# Patient Record
Sex: Male | Born: 1960 | Race: White | Hispanic: No | Smoking: Former smoker
Health system: Southern US, Community
[De-identification: ages and names within clinical notes are randomized; demographics above are authoritative.]

## PROBLEM LIST (undated history)

## (undated) DIAGNOSIS — I491 Atrial premature depolarization: Secondary | ICD-10-CM

## (undated) DIAGNOSIS — I499 Cardiac arrhythmia, unspecified: Secondary | ICD-10-CM

## (undated) DIAGNOSIS — IMO0001 Reserved for inherently not codable concepts without codable children: Secondary | ICD-10-CM

## (undated) DIAGNOSIS — M1990 Unspecified osteoarthritis, unspecified site: Secondary | ICD-10-CM

## (undated) DIAGNOSIS — Z87442 Personal history of urinary calculi: Secondary | ICD-10-CM

## (undated) DIAGNOSIS — G4730 Sleep apnea, unspecified: Secondary | ICD-10-CM

## (undated) DIAGNOSIS — K259 Gastric ulcer, unspecified as acute or chronic, without hemorrhage or perforation: Secondary | ICD-10-CM

## (undated) DIAGNOSIS — K219 Gastro-esophageal reflux disease without esophagitis: Secondary | ICD-10-CM

## (undated) DIAGNOSIS — I493 Ventricular premature depolarization: Secondary | ICD-10-CM

---

## 1998-04-18 ENCOUNTER — Emergency Department (HOSPITAL_COMMUNITY): Admission: EM | Admit: 1998-04-18 | Discharge: 1998-04-18 | Payer: Self-pay | Admitting: Emergency Medicine

## 2006-09-19 ENCOUNTER — Ambulatory Visit (HOSPITAL_BASED_OUTPATIENT_CLINIC_OR_DEPARTMENT_OTHER): Admission: RE | Admit: 2006-09-19 | Discharge: 2006-09-19 | Payer: Self-pay | Admitting: Family Medicine

## 2006-09-25 ENCOUNTER — Ambulatory Visit: Payer: Self-pay | Admitting: Internal Medicine

## 2010-05-27 NOTE — Procedures (Signed)
NAMEGEAROLD, [name]               ACCOUNT NO.:  [id number]   MEDICAL RECORD NO.:  [id number]          PATIENT TYPE:  OUT   LOCATION:  SLEEP CENTER                 FACILITY:  [facility]   PHYSICIAN:  Clinton D. Maple Hudson, MD, FCCP, FACPDATE OF BIRTH:  1958/10/22   DATE OF STUDY:  09/19/2006                            NOCTURNAL POLYSOMNOGRAM   REFERRING PHYSICIAN:  Gloriajean Dell. Andrey Campanile, M.D.   INDICATION FOR STUDY:  Hypersomnia with sleep apnea.   EPWORTH SLEEPINESS SCORE:  13/24, BMI 25.6, weight 200 pounds.   MEDICATIONS:  Home medications are listed and reviewed.   SLEEP ARCHITECTURE:  Short total sleep time 217 minutes with sleep  efficiency 59%.  Stage I was 14%, stage II 74%, stage III absent, REM  12% of total sleep time.  Sleep latency 13 minutes, REM latency 248  minutes, awake after sleep onset 135 minutes, arousal index 20.5.  He  was awake almost 2-1/2 hours during the study, particularly between  midnight and 1 a.m. and between 2 a.m. and 3 a.m.  Afrin Nasal Spray was  used at 9:20 p.m.   RESPIRATORY DATA:  Split study protocol.  Apnea-hypopnea index (AHI,  RDI) 22.8 obstructive events per hour, indicating moderate obstructive  sleep apnea/hypopnea syndrome before CPAP.  There were 3 obstructive  apneas and 45 hypopneas before CPAP.  Events were positional, mostly  noted while sleeping supine.  REM AHI 4.6 per hour.  CPAP was titrated  to 8 CWP, AHI 0 per hour.  A medium ComfortGel Respironics Nasal Mask  was chosen with heated humidifier.   OXYGEN DATA:  Moderately loud snoring before CPAP with oxygen  desaturation nadir 80%.  After CPAP control saturation held 95% on room  air.   CARDIAC DATA:  Normal sinus rhythm.   MOVEMENT-PARASOMNIA:  No significant movement disturbance, bathroom x1.   IMPRESSIONS-RECOMMENDATIONS:  1. Moderate obstructive sleep apnea/hypopnea syndrome, apnea-hypopnea      index 22.8 per hour.  Events were more common while supine.      Moderately loud  snoring with oxygen desaturation nadir 80%.  2. Successful continuous positive airway pressure titration to 8      centimeters of water pressure, apnea-hypopnea index 0 per hour.  A      medium ComfortGel Nasal Respironics Mask was used with heated      humidifier.      Technician indicated that the patient was somewhat apprehensive and      needed time to adjust to the continuous positive airway pressure      mask.      Clinton D. Maple Hudson, MD, FCCP, FACP  Diplomate, Biomedical engineer of Sleep Medicine  Electronically Signed     CDY/MEDQ  D:  09/25/2006 13:51:47  T:  09/26/2006 11:54:00  Job:  [id number]

## 2012-02-16 ENCOUNTER — Encounter (HOSPITAL_BASED_OUTPATIENT_CLINIC_OR_DEPARTMENT_OTHER): Payer: Self-pay

## 2012-02-16 ENCOUNTER — Emergency Department (HOSPITAL_BASED_OUTPATIENT_CLINIC_OR_DEPARTMENT_OTHER): Payer: Worker's Compensation

## 2012-02-16 ENCOUNTER — Emergency Department (HOSPITAL_BASED_OUTPATIENT_CLINIC_OR_DEPARTMENT_OTHER)
Admission: EM | Admit: 2012-02-16 | Discharge: 2012-02-16 | Disposition: A | Discharge status: Home / Self Care | Payer: Worker's Compensation | Attending: Emergency Medicine | Admitting: Emergency Medicine

## 2012-02-16 DIAGNOSIS — S68119A Complete traumatic metacarpophalangeal amputation of unspecified finger, initial encounter: Secondary | ICD-10-CM

## 2012-02-16 DIAGNOSIS — Z79899 Other long term (current) drug therapy: Secondary | ICD-10-CM | POA: Insufficient documentation

## 2012-02-16 DIAGNOSIS — Z8679 Personal history of other diseases of the circulatory system: Secondary | ICD-10-CM | POA: Insufficient documentation

## 2012-02-16 DIAGNOSIS — Y929 Unspecified place or not applicable: Secondary | ICD-10-CM | POA: Insufficient documentation

## 2012-02-16 DIAGNOSIS — K219 Gastro-esophageal reflux disease without esophagitis: Secondary | ICD-10-CM | POA: Insufficient documentation

## 2012-02-16 DIAGNOSIS — W268XXA Contact with other sharp object(s), not elsewhere classified, initial encounter: Secondary | ICD-10-CM | POA: Insufficient documentation

## 2012-02-16 DIAGNOSIS — Y9389 Activity, other specified: Secondary | ICD-10-CM | POA: Insufficient documentation

## 2012-02-16 DIAGNOSIS — Z8711 Personal history of peptic ulcer disease: Secondary | ICD-10-CM | POA: Insufficient documentation

## 2012-02-16 DIAGNOSIS — IMO0002 Reserved for concepts with insufficient information to code with codable children: Secondary | ICD-10-CM | POA: Insufficient documentation

## 2012-02-16 HISTORY — DX: Atrial premature depolarization: I49.1

## 2012-02-16 HISTORY — DX: Gastric ulcer, unspecified as acute or chronic, without hemorrhage or perforation: K25.9

## 2012-02-16 HISTORY — DX: Gastro-esophageal reflux disease without esophagitis: K21.9

## 2012-02-16 HISTORY — DX: Ventricular premature depolarization: I49.3

## 2012-02-16 HISTORY — DX: Reserved for inherently not codable concepts without codable children: IMO0001

## 2012-02-16 MED ORDER — CEPHALEXIN 500 MG PO CAPS: 500.0000 mg | ORAL_CAPSULE | Freq: Four times a day (QID) | ORAL | Status: AC

## 2012-02-16 MED ORDER — TETANUS-DIPHTH-ACELL PERTUSSIS 5-2.5-18.5 LF-MCG/0.5 IM SUSP: 0.5000 mL | Freq: Once | INTRAMUSCULAR | Status: DC

## 2012-02-16 MED ORDER — OXYCODONE-ACETAMINOPHEN 5-325 MG PO TABS: 1.0000 | ORAL_TABLET | ORAL | Status: DC | PRN

## 2012-02-16 MED ORDER — CEPHALEXIN 250 MG PO CAPS
500.0000 mg | ORAL_CAPSULE | Freq: Once | ORAL | Status: AC
  Administered 2012-02-16: 500 mg via ORAL
  Filled 2012-02-16: qty 2

## 2012-02-16 NOTE — ED Notes (Signed)
Phoned pt PCP [facility] regarding td status, last td 06/24/07 per RN.

## 2012-02-16 NOTE — ED Notes (Signed)
Patient transported to X-ray 

## 2012-02-16 NOTE — ED Notes (Signed)
Partial amputation of tip of right index finger.

## 2012-02-16 NOTE — ED Provider Notes (Signed)
History     CSN: [id number]  Arrival date & time 02/16/12  1127   First MD Initiated Contact with Patient 02/16/12 1137      Chief Complaint  Patient presents with  . Finger Injury    (Consider location/radiation/quality/duration/timing/severity/associated sxs/prior treatment) HPI  Finger injury at work just [employer].  Patient states a plate cut the tip of his finger off.  He denies other injury.  He is having mild bleeding and moderate pain.  The nail was completely avulsed.    Past Medical History  Diagnosis Date  . PAC (premature atrial contraction)   . PVC (premature ventricular contraction)   . Gastric ulcer   . Reflux     History reviewed. No pertinent past surgical history.  No family history on file.  History  Substance Use Topics  . Smoking status: Never Smoker   . Smokeless tobacco: Not on file  . Alcohol Use: No      Review of Systems  All other systems reviewed and are negative.    Allergies  Review of patient's allergies indicates no known allergies.  Home Medications   Current Outpatient Rx  Name  Route  Sig  Dispense  Refill  . DOXAZOSIN MESYLATE 4 MG PO TABS   Oral   Take 4 mg by mouth at bedtime.         Marland Kitchen FLUTICASONE PROPIONATE 50 MCG/ACT NA SUSP   Nasal   Place 2 sprays into the nose daily.         Marland Kitchen HYDROCODONE-ACETAMINOPHEN PO   Oral   Take by mouth as needed.         Marland Kitchen PRILOSEC PO   Oral   Take by mouth.         . VERAPAMIL HCL 120 MG PO TABS   Oral   Take 120 mg by mouth 3 (three) times daily.           BP 124/83  Pulse 82  Temp 98.2 F (36.8 C) (Oral)  Resp 20  Ht 6\' 2"  (1.88 m)  Wt 210 lb (95.255 kg)  BMI 26.96 kg/m2  SpO2 97%  Physical Exam  Nursing note and vitals reviewed. Constitutional: He appears well-developed and well-nourished.  HENT:  Head: Normocephalic and atraumatic.  Neck: Normal range of motion.  Musculoskeletal:       Hands:      Right fourth finger tip amputation, nail avulsed.  Full arom finger.     ED Course  LACERATION REPAIR Date/Time: 02/16/2012 12:47 PM Performed by: Hilario Quarry Authorized by: Hilario Quarry Consent: Verbal consent obtained. Risks and benefits: risks, benefits and alternatives were discussed Consent given by: patient Patient understanding: patient states understanding of the procedure being performed Patient identity confirmed: verbally with patient Time out: Immediately prior to procedure a "time out" was called to verify the correct patient, procedure, equipment, support staff and site/side marked as required. Body area: upper extremity Laceration length: 2 cm Foreign bodies: no foreign bodies Tendon involvement: none Anesthesia: digital block Local anesthetic: lidocaine 1% with epinephrine Anesthetic total: 3 ml Preparation: Patient was prepped and draped in the usual sterile fashion. Irrigation solution: saline Amount of cleaning: standard Debridement: none Skin closure: 4-0 Prolene Number of sutures: 4 Approximation difficulty: simple Comments: vaseline gauze placed under nail fold and nail bed and across repaired tip   (including critical care time)  Labs Reviewed - No data to display Dg Finger Ring Right  02/16/2012  *RADIOLOGY REPORT*  Clinical  Data: Pain post trauma  RIGHT RING FINGER 2+V  Comparison: None.  Findings: Frontal, oblique, and lateral views were obtained. There is soft tissue amputation of the distal aspect of the fourth distal phalanx.  There is no fracture.  No dislocation.  Joint spaces appear intact.  No radiopaque foreign body.  IMPRESSION: Soft tissue amputation distally without bony abnormality.  In particular, no fracture or dislocation.   Original Report Authenticated By: Bretta Bang, M.D.      No diagnosis found.    MDM  Discussed with Dr. Merlyn Lot and plan keflex.    He will see him in follow up tomorrow.     Hilario Quarry, MD 02/16/12 1540

## 2012-02-17 ENCOUNTER — Encounter (HOSPITAL_BASED_OUTPATIENT_CLINIC_OR_DEPARTMENT_OTHER)
Admission: RE | Disposition: A | Discharge status: Home / Self Care | Payer: Self-pay | Source: Ambulatory Visit | Attending: Orthopedic Surgery

## 2012-02-17 ENCOUNTER — Other Ambulatory Visit: Payer: Self-pay | Admitting: Orthopedic Surgery

## 2012-02-17 ENCOUNTER — Encounter (HOSPITAL_BASED_OUTPATIENT_CLINIC_OR_DEPARTMENT_OTHER): Payer: Self-pay | Admitting: Orthopedic Surgery

## 2012-02-17 ENCOUNTER — Ambulatory Visit (HOSPITAL_BASED_OUTPATIENT_CLINIC_OR_DEPARTMENT_OTHER)
Admission: RE | Admit: 2012-02-17 | Discharge: 2012-02-17 | Disposition: A | Discharge status: Home / Self Care | Payer: Worker's Compensation | Source: Ambulatory Visit | Attending: Orthopedic Surgery | Admitting: Orthopedic Surgery

## 2012-02-17 DIAGNOSIS — IMO0002 Reserved for concepts with insufficient information to code with codable children: Secondary | ICD-10-CM | POA: Insufficient documentation

## 2012-02-17 DIAGNOSIS — W230XXA Caught, crushed, jammed, or pinched between moving objects, initial encounter: Secondary | ICD-10-CM | POA: Insufficient documentation

## 2012-02-17 DIAGNOSIS — Y9289 Other specified places as the place of occurrence of the external cause: Secondary | ICD-10-CM | POA: Insufficient documentation

## 2012-02-17 DIAGNOSIS — Y990 Civilian activity done for income or pay: Secondary | ICD-10-CM | POA: Insufficient documentation

## 2012-02-17 HISTORY — PX: MINOR AMPUTATION OF DIGIT: SHX6242

## 2012-02-17 SURGERY — MINOR AMPUTATION OF DIGIT
Anesthesia: LOCAL | Site: Finger | Laterality: Right

## 2012-02-17 MED ORDER — BUPIVACAINE HCL (PF) 0.25 % IJ SOLN
INTRAMUSCULAR | Status: DC | PRN
  Administered 2012-02-17: 5 mL

## 2012-02-17 MED ORDER — LIDOCAINE HCL (PF) 1 % IJ SOLN
INTRAMUSCULAR | Status: DC | PRN
  Administered 2012-02-17: 5 mL

## 2012-02-17 MED ORDER — OXYCODONE-ACETAMINOPHEN 5-325 MG PO TABS: ORAL_TABLET | ORAL | Status: DC

## 2012-02-17 SURGICAL SUPPLY — 37 items
BANDAGE CONFORM 2  STR LF (GAUZE/BANDAGES/DRESSINGS) ×2 IMPLANT
BANDAGE ELASTIC 3 VELCRO ST LF (GAUZE/BANDAGES/DRESSINGS) IMPLANT
BANDAGE GAUZE STRT 1 STR LF (GAUZE/BANDAGES/DRESSINGS) IMPLANT
BLADE MINI RND TIP GREEN BEAV (BLADE) IMPLANT
BLADE SURG 15 STRL LF DISP TIS (BLADE) ×1 IMPLANT
BLADE SURG 15 STRL SS (BLADE) ×1
BNDG COHESIVE 1X5 TAN STRL LF (GAUZE/BANDAGES/DRESSINGS) ×2 IMPLANT
BNDG ELASTIC 2 VLCR STRL LF (GAUZE/BANDAGES/DRESSINGS) IMPLANT
BNDG ESMARK 4X9 LF (GAUZE/BANDAGES/DRESSINGS) IMPLANT
CHLORAPREP W/TINT 26ML (MISCELLANEOUS) IMPLANT
CLOTH BEACON ORANGE TIMEOUT ST (SAFETY) ×2 IMPLANT
DECANTER SPIKE VIAL GLASS SM (MISCELLANEOUS) IMPLANT
DRAIN PENROSE 1/4X12 LTX STRL (WOUND CARE) ×2 IMPLANT
DRAPE SURG 17X23 STRL (DRAPES) ×2 IMPLANT
GAUZE SPONGE 4X4 12PLY STRL LF (GAUZE/BANDAGES/DRESSINGS) ×2 IMPLANT
GAUZE XEROFORM 1X8 LF (GAUZE/BANDAGES/DRESSINGS) ×4 IMPLANT
GLOVE BIO SURGEON STRL SZ7.5 (GLOVE) ×2 IMPLANT
GLOVE BIOGEL PI IND STRL 8 (GLOVE) ×1 IMPLANT
GLOVE BIOGEL PI INDICATOR 8 (GLOVE) ×1
NS IRRIG 1000ML POUR BTL (IV SOLUTION) ×2 IMPLANT
PACK BASIN DAY SURGERY FS (CUSTOM PROCEDURE TRAY) ×2 IMPLANT
PADDING CAST ABS 4INX4YD NS (CAST SUPPLIES)
PADDING CAST ABS COTTON 4X4 ST (CAST SUPPLIES) IMPLANT
SPLINT FNGR BALL END 5/8X4.25 (SOFTGOODS) IMPLANT
SPLINT PLASTALUME BALL 4 1/4IN (SOFTGOODS)
SPONGE GAUZE 4X4 12PLY (GAUZE/BANDAGES/DRESSINGS) ×2 IMPLANT
SUT CHROMIC 4 0 P 3 18 (SUTURE) IMPLANT
SUT ETHILON 3 0 PS 1 (SUTURE) IMPLANT
SUT ETHILON 4 0 PS 2 18 (SUTURE) IMPLANT
SUT MERSILENE 4 0 P 3 (SUTURE) IMPLANT
SUT MON AB 5-0 P3 18 (SUTURE) ×2 IMPLANT
SUT VIC AB 4-0 P-3 18XBRD (SUTURE) IMPLANT
SUT VIC AB 4-0 P3 18 (SUTURE)
SYR BULB 3OZ (MISCELLANEOUS) ×2 IMPLANT
SYR CONTROL 10ML LL (SYRINGE) ×2 IMPLANT
TOWEL OR 17X24 6PK STRL BLUE (TOWEL DISPOSABLE) ×4 IMPLANT
UNDERPAD 30X30 INCONTINENT (UNDERPADS AND DIAPERS) ×2 IMPLANT

## 2012-02-17 NOTE — Op Note (Deleted)
NAME:  [name], [name]               ACCOUNT NO.:  [id number]  MEDICAL RECORD NO.:  [id number]  LOCATION:  [facility]                         FACILITY:  [facility]  PHYSICIAN:  Arther Heisler, MD        DATE OF BIRTH:  11/08/1960  DATE OF PROCEDURE:  02/17/2012 DATE OF DISCHARGE:  02/17/2012                              OPERATIVE REPORT   PREOPERATIVE DIAGNOSIS:  Right ring finger amputation.  POSTOPERATIVE DIAGNOSIS:  Right ring finger amputation.  PROCEDURE:  Revision amputation right ring finger.  SURGEON:  Dhanya Bogle, MD  ASSISTANT:  None.  ANESTHESIA:  Digital block with half and half solution 1% plain lidocaine and 0.25% plain Marcaine.  TOURNIQUET:  Penrose drain, 13 minutes.  DISPOSITION:  Stable to PACU.  INDICATIONS:  [name] is a 51-year-old right-hand dominant male who injured his finger at work yesterday when a stone table and cart pinched the tip of the finger off.  He was seen at [facility] where the wound was irrigated and sutures placed.  He followed up with me in the office.  On evaluation, it was felt that the tip of the bone may still be exposed and they were nonabsorbable sutures in the finger.  I recommended going to the operating room for revision amputation, including the wound.  Risks, benefits, and alternatives of the procedure were discussed including risk of blood loss, infection, damage to nerves, vessels, tendons, ligaments, bone; failure of procedure; need for additional procedures; complications with healing.  He voiced understanding of these risks and elected to proceed.  OPERATIVE COURSE:  The patient was transported to the operating room, placed on the operating table in supine position with the right upper extremity on arm board.  A surgical pause performed between myself, the patient, and operating room staff, and all were in agreement so the patient, procedure, and site of procedure.  A digital block was performed with 10 mL of  half and half solution 1% plain Xylocaine, 0.25% plain Marcaine.  This gave total digital anesthesia of the right ring finger.  The hand was prepped with Betadine and sterilely draped.  A Penrose drain was placed and used as a tourniquet and was up for 13 minutes.  The Prolene sutures were removed.  The wound was copiously irrigated with sterile saline.  The tuft of the distal phalanx was exposed.  Rongeurs were used to take this back to the level of the nail bed.  The soft tissues volarly were mobilized.  They were able to be brought over the end of the bone.  There was no gross contamination.  5- 0 Monocryl suture was used to repair the soft tissues over the end of the bone.  Two V cuts were made, one on each side of the finger to make a more rounded contour to the finger.  The Penrose drain was removed at 13 minutes.  To the fingertips where a piece of Xeroform was placed in the nail fold and the wound dressed with sterile Xeroform.  A 4 x 4 and Kling was wrapped on the finger lightly.  Alumafoam splint was placed and wrapped with Coban dressing.  The Penrose drain   had been removed. The operative drapes were broken down.  The patient was taken out from the operating room safely.  I will see him back in the office in 1 week for postprocedure followup.  He will continue on the Keflex that he was given by the [facility].  I will write him a new prescription for Percocet 5/325, 1-2 p.o. q.6 hours p.r.n. pain, dispensed #40.     Eliezer Khawaja, MD     KK/MEDQ  D:  02/17/2012  T:  02/17/2012  Job:  [id number] 

## 2012-02-17 NOTE — H&P (Signed)
  [name] is an 52 y.o. male.   Chief Complaint: right ring finger amputation HPI: 52 yo rhd male states while at work yesterday a stone plate and cart pinched tip of right ring finger off.  Seen at [facility] where wound cleaned and sutures placed.  Nail had been avulsed.  Reports no previous injury to finger and no other injury at this time.  Past Medical History  Diagnosis Date  . PAC (premature atrial contraction)   . PVC (premature ventricular contraction)   . Gastric ulcer   . Reflux     No past surgical history on file.  No family history on file. Social History:  reports that he has never smoked. He does not have any smokeless tobacco history on file. He reports that he does not drink alcohol or use illicit drugs.  Allergies: No Known Allergies  Medications Prior to Admission  Medication Sig Dispense Refill  . cephALEXin (KEFLEX) 500 MG capsule Take 1 capsule (500 mg total) by mouth 4 (four) times daily.  28 capsule  0  . doxazosin (CARDURA) 4 MG tablet Take 4 mg by mouth at bedtime.      . fluticasone (FLONASE) 50 MCG/ACT nasal spray Place 2 sprays into the nose daily.      Marland Kitchen HYDROCODONE-ACETAMINOPHEN PO Take by mouth as needed.      . Omeprazole (PRILOSEC PO) Take by mouth.      . oxyCODONE-acetaminophen (PERCOCET/ROXICET) 5-325 MG per tablet Take 1 tablet by mouth every 4 (four) hours as needed for pain.  15 tablet  0  . verapamil (CALAN) 120 MG tablet Take 120 mg by mouth 3 (three) times daily.        No results found for this or any previous visit (from the past 48 hour(s)).  Dg Finger Ring Right  02/16/2012  *RADIOLOGY REPORT*  Clinical Data: Pain post trauma  RIGHT RING FINGER 2+V  Comparison: None.  Findings: Frontal, oblique, and lateral views were obtained. There is soft tissue amputation of the distal aspect of the fourth distal phalanx.  There is no fracture.  No dislocation.  Joint spaces appear intact.  No radiopaque foreign body.  IMPRESSION: Soft tissue  amputation distally without bony abnormality.  In particular, no fracture or dislocation.   Original Report Authenticated By: Bretta Bang, M.D.      A comprehensive review of systems was negative except for: Eyes: positive for contacts/glasses Gastrointestinal: positive for ulcer Genitourinary: positive for hematuria Behavioral/Psych: positive for sleep disturbance  There were no vitals taken for this visit.  General appearance: alert, cooperative and appears stated age Head: Normocephalic, without obvious abnormality, atraumatic Neck: supple, symmetrical, trachea midline Resp: clear to auscultation bilaterally Cardio: regular rate and rhythm GI: non tender Extremities: intact sensation and capillary refill all digits except right ring where distal aspect has been amputated.  +epl/fpl/io.  non absorbable suture in fingertip.   Pulses: 2+ and symmetric Skin: as above Neurologic: Grossly normal Incision/Wound: As above  Assessment/Plan Right ring finger amputation.  Recommend OR for revision amputation.  Risks, benefits, and alternatives of surgery were discussed and the patient agrees with the plan of care.   Verdene Creson R 02/17/2012, 12:31 PM

## 2012-02-17 NOTE — Brief Op Note (Signed)
02/17/2012  1:20 PM  PATIENT:  [name]  52 y.o. male  PRE-OPERATIVE DIAGNOSIS:  rt ring amputation  POST-OPERATIVE DIAGNOSIS:  * No post-op diagnosis entered *  PROCEDURE:  Procedure(s) (LRB) with comments: MINOR AMPUTATION OF DIGIT (Right) - revision amputation right ring finger   SURGEON:  Surgeon(s) and Role:    * Tami Ribas, MD - Primary  PHYSICIAN ASSISTANT:   ASSISTANTS: none   ANESTHESIA:   local  EBL:     BLOOD ADMINISTERED:none  DRAINS: none   LOCAL MEDICATIONS USED:  MARCAINE    and XYLOCAINE   SPECIMEN:  No Specimen  DISPOSITION OF SPECIMEN:  N/A  COUNTS:  YES  TOURNIQUET:   Total Tourniquet Time Documented: area (Right) - 13 minutes  DICTATION: .Other Dictation: Dictation Number [id number]  PLAN OF CARE: Discharge to home after PACU  PATIENT DISPOSITION:  stable

## 2012-02-17 NOTE — Op Note (Signed)
[id number] 

## 2012-02-17 NOTE — Op Note (Signed)
[name], [name]               ACCOUNT NO.:  [id number]  MEDICAL RECORD NO.:  [id number]  LOCATION:  [facility]                         FACILITY:  [facility]  PHYSICIAN:  Betha Loa, MD        DATE OF BIRTH:  April 03, 1960  DATE OF PROCEDURE:  02/17/2012 DATE OF DISCHARGE:  02/17/2012                              OPERATIVE REPORT   PREOPERATIVE DIAGNOSIS:  Right ring finger amputation.  POSTOPERATIVE DIAGNOSIS:  Right ring finger amputation.  PROCEDURE:  Revision amputation right ring finger.  SURGEON:  Betha Loa, MD  ASSISTANT:  None.  ANESTHESIA:  Digital block with half and half solution 1% plain lidocaine and 0.25% plain Marcaine.  TOURNIQUET:  Penrose drain, 13 minutes.  DISPOSITION:  Stable to PACU.  INDICATIONS:  [name] is a 52 year old right-hand dominant male who injured his finger at work yesterday when a stone table and cart pinched the tip of the finger off.  He was seen at [facility] where the wound was irrigated and sutures placed.  He followed up with me in the office.  On evaluation, it was felt that the tip of the bone may still be exposed and they were nonabsorbable sutures in the finger.  I recommended going to the operating room for revision amputation, including the wound.  Risks, benefits, and alternatives of the procedure were discussed including risk of blood loss, infection, damage to nerves, vessels, tendons, ligaments, bone; failure of procedure; need for additional procedures; complications with healing.  He voiced understanding of these risks and elected to proceed.  OPERATIVE COURSE:  The patient was transported to the operating room, placed on the operating table in supine position with the right upper extremity on arm board.  A surgical pause performed between myself, the patient, and operating room staff, and all were in agreement so the patient, procedure, and site of procedure.  A digital block was performed with 10 mL of  half and half solution 1% plain Xylocaine, 0.25% plain Marcaine.  This gave total digital anesthesia of the right ring finger.  The hand was prepped with Betadine and sterilely draped.  A Penrose drain was placed and used as a tourniquet and was up for 13 minutes.  The Prolene sutures were removed.  The wound was copiously irrigated with sterile saline.  The tuft of the distal phalanx was exposed.  Rongeurs were used to take this back to the level of the nail bed.  The soft tissues volarly were mobilized.  They were able to be brought over the end of the bone.  There was no gross contamination.  5- 0 Monocryl suture was used to repair the soft tissues over the end of the bone.  Two V cuts were made, one on each side of the finger to make a more rounded contour to the finger.  The Penrose drain was removed at 13 minutes.  To the fingertips where a piece of Xeroform was placed in the nail fold and the wound dressed with sterile Xeroform.  A 4 x 4 and Kling was wrapped on the finger lightly.  Alumafoam splint was placed and wrapped with Coban dressing.  The Penrose drain  had been removed. The operative drapes were broken down.  The patient was taken out from the operating room safely.  I will see him back in the office in 1 week for postprocedure followup.  He will continue on the Keflex that he was given by the [facility].  I will write him a new prescription for Percocet 5/325, 1-2 p.o. q.6 hours p.r.n. pain, dispensed #40.     Betha Loa, MD     KK/MEDQ  D:  02/17/2012  T:  02/17/2012  Job:  [id number]

## 2012-02-18 ENCOUNTER — Encounter (HOSPITAL_BASED_OUTPATIENT_CLINIC_OR_DEPARTMENT_OTHER): Payer: Self-pay | Admitting: Orthopedic Surgery

## 2017-04-13 ENCOUNTER — Ambulatory Visit
Admission: RE | Admit: 2017-04-13 | Discharge: 2017-04-13 | Disposition: A | Discharge status: Home / Self Care | Payer: BLUE CROSS/BLUE SHIELD | Source: Ambulatory Visit | Attending: Family Medicine | Admitting: Family Medicine

## 2017-04-13 ENCOUNTER — Other Ambulatory Visit: Payer: Self-pay | Admitting: Family Medicine

## 2017-04-13 DIAGNOSIS — J111 Influenza due to unidentified influenza virus with other respiratory manifestations: Secondary | ICD-10-CM

## 2020-05-10 ENCOUNTER — Other Ambulatory Visit: Payer: Self-pay | Admitting: Urology

## 2020-05-10 ENCOUNTER — Encounter (HOSPITAL_BASED_OUTPATIENT_CLINIC_OR_DEPARTMENT_OTHER): Payer: Self-pay | Admitting: Urology

## 2020-05-10 DIAGNOSIS — N200 Calculus of kidney: Secondary | ICD-10-CM

## 2020-05-10 NOTE — Progress Notes (Signed)
Patient to arrive at 0915 on 05/13/2020. History and medications reviewed. Pre-procedure instructions given. History of arrhythmia, but no recent EKG. Denies chest pain or SOB, asymptomatic. [facility] made aware.NPO after MN on Sunday except for clear liquids until 0715. Driver secured.

## 2020-05-13 ENCOUNTER — Encounter (HOSPITAL_BASED_OUTPATIENT_CLINIC_OR_DEPARTMENT_OTHER): Payer: Self-pay | Admitting: Urology

## 2020-05-13 ENCOUNTER — Encounter (HOSPITAL_BASED_OUTPATIENT_CLINIC_OR_DEPARTMENT_OTHER)
Admission: RE | Disposition: A | Discharge status: Home / Self Care | Payer: Self-pay | Source: Ambulatory Visit | Attending: Urology

## 2020-05-13 ENCOUNTER — Other Ambulatory Visit: Payer: Self-pay

## 2020-05-13 ENCOUNTER — Ambulatory Visit (HOSPITAL_COMMUNITY): Payer: BC Managed Care – PPO

## 2020-05-13 ENCOUNTER — Emergency Department (HOSPITAL_BASED_OUTPATIENT_CLINIC_OR_DEPARTMENT_OTHER)
Admission: EM | Admit: 2020-05-13 | Discharge: 2020-05-13 | Disposition: A | Discharge status: Home / Self Care | Payer: BC Managed Care – PPO | Source: Home / Self Care | Attending: Emergency Medicine | Admitting: Emergency Medicine

## 2020-05-13 ENCOUNTER — Ambulatory Visit (HOSPITAL_BASED_OUTPATIENT_CLINIC_OR_DEPARTMENT_OTHER)
Admission: RE | Admit: 2020-05-13 | Discharge: 2020-05-13 | Disposition: A | Discharge status: Home / Self Care | Payer: BC Managed Care – PPO | Source: Ambulatory Visit | Attending: Urology | Admitting: Urology

## 2020-05-13 ENCOUNTER — Encounter (HOSPITAL_BASED_OUTPATIENT_CLINIC_OR_DEPARTMENT_OTHER): Payer: Self-pay

## 2020-05-13 DIAGNOSIS — N23 Unspecified renal colic: Secondary | ICD-10-CM | POA: Insufficient documentation

## 2020-05-13 DIAGNOSIS — K219 Gastro-esophageal reflux disease without esophagitis: Secondary | ICD-10-CM | POA: Insufficient documentation

## 2020-05-13 DIAGNOSIS — Z87891 Personal history of nicotine dependence: Secondary | ICD-10-CM | POA: Insufficient documentation

## 2020-05-13 DIAGNOSIS — Z20822 Contact with and (suspected) exposure to covid-19: Secondary | ICD-10-CM | POA: Diagnosis not present

## 2020-05-13 DIAGNOSIS — G4730 Sleep apnea, unspecified: Secondary | ICD-10-CM | POA: Insufficient documentation

## 2020-05-13 DIAGNOSIS — N200 Calculus of kidney: Secondary | ICD-10-CM | POA: Insufficient documentation

## 2020-05-13 HISTORY — DX: Personal history of urinary calculi: Z87.442

## 2020-05-13 HISTORY — DX: Unspecified osteoarthritis, unspecified site: M19.90

## 2020-05-13 HISTORY — DX: Gastro-esophageal reflux disease without esophagitis: K21.9

## 2020-05-13 HISTORY — DX: Cardiac arrhythmia, unspecified: I49.9

## 2020-05-13 HISTORY — PX: EXTRACORPOREAL SHOCK WAVE LITHOTRIPSY: SHX1557

## 2020-05-13 HISTORY — DX: Sleep apnea, unspecified: G47.30

## 2020-05-13 LAB — COMPREHENSIVE METABOLIC PANEL
ALT: 26 U/L (ref 0–44)
AST: 23 U/L (ref 15–41)
Albumin: 4.3 g/dL (ref 3.5–5.0)
Alkaline Phosphatase: 60 U/L (ref 38–126)
Anion gap: 9 (ref 5–15)
BUN: 18 mg/dL (ref 8–23)
CO2: 24 mmol/L (ref 22–32)
Calcium: 9.3 mg/dL (ref 8.9–10.3)
Chloride: 105 mmol/L (ref 98–111)
Creatinine, Ser: 0.98 mg/dL (ref 0.61–1.24)
GFR, Estimated: >60 mL/min (ref >60)
Glucose, Bld: 115 mg/dL — ABNORMAL HIGH (ref 70–99)
Potassium: 3.8 mmol/L (ref 3.5–5.1)
Sodium: 138 mmol/L (ref 135–145)
Total Bilirubin: 0.7 mg/dL (ref 0.3–1.2)
Total Protein: 6.5 g/dL (ref 6.5–8.1)

## 2020-05-13 LAB — URINALYSIS, ROUTINE W REFLEX MICROSCOPIC
Bilirubin Urine: NEGATIVE
Glucose, UA: NEGATIVE mg/dL
Ketones, ur: NEGATIVE mg/dL
Nitrite: NEGATIVE
Protein, ur: 100 mg/dL — AB
RBC / HPF: >50 RBC/hpf — ABNORMAL HIGH (ref 0–5)
Specific Gravity, Urine: 1.02 (ref 1.005–1.030)
pH: 5 (ref 5.0–8.0)

## 2020-05-13 LAB — CBC WITH DIFFERENTIAL/PLATELET
Abs Immature Granulocytes: 0.02 10*3/uL (ref 0.00–0.07)
Basophils Absolute: 0.1 10*3/uL (ref 0.0–0.1)
Basophils Relative: 1 %
Eosinophils Absolute: 0.3 10*3/uL (ref 0.0–0.5)
Eosinophils Relative: 2 %
HCT: 45 % (ref 39.0–52.0)
Hemoglobin: 15.6 g/dL (ref 13.0–17.0)
Immature Granulocytes: 0 %
Lymphocytes Relative: 17 %
Lymphs Abs: 2.1 10*3/uL (ref 0.7–4.0)
MCH: 31.5 pg (ref 26.0–34.0)
MCHC: 34.7 g/dL (ref 30.0–36.0)
MCV: 90.7 fL (ref 80.0–100.0)
Monocytes Absolute: 1.1 10*3/uL — ABNORMAL HIGH (ref 0.1–1.0)
Monocytes Relative: 9 %
Neutro Abs: 9.1 10*3/uL — ABNORMAL HIGH (ref 1.7–7.7)
Neutrophils Relative %: 71 %
Platelets: 204 10*3/uL (ref 150–400)
RBC: 4.96 MIL/uL (ref 4.22–5.81)
RDW: 13 % (ref 11.5–15.5)
WBC: 12.8 10*3/uL — ABNORMAL HIGH (ref 4.0–10.5)
nRBC: 0 % (ref 0.0–0.2)

## 2020-05-13 LAB — RESP PANEL BY RT-PCR (FLU A&B, COVID) ARPGX2
Influenza A by PCR: NEGATIVE
Influenza B by PCR: NEGATIVE
SARS Coronavirus 2 by RT PCR: NEGATIVE

## 2020-05-13 LAB — LIPASE, BLOOD: Lipase: 14 U/L (ref 11–51)

## 2020-05-13 SURGERY — LITHOTRIPSY, ESWL
Anesthesia: LOCAL | Laterality: Left

## 2020-05-13 MED ORDER — CIPROFLOXACIN HCL 500 MG PO TABS
ORAL_TABLET | ORAL | Status: AC
  Filled 2020-05-13: qty 1

## 2020-05-13 MED ORDER — DIAZEPAM 5 MG PO TABS
ORAL_TABLET | ORAL | Status: AC
  Filled 2020-05-13: qty 2

## 2020-05-13 MED ORDER — SODIUM CHLORIDE 0.9 % IV BOLUS
1000.0000 mL | Freq: Once | INTRAVENOUS | Status: AC
  Administered 2020-05-13: 1000 mL via INTRAVENOUS

## 2020-05-13 MED ORDER — DIPHENHYDRAMINE HCL 25 MG PO CAPS
ORAL_CAPSULE | ORAL | Status: AC
  Filled 2020-05-13: qty 1

## 2020-05-13 MED ORDER — HYDROMORPHONE HCL 1 MG/ML IJ SOLN
1.0000 mg | Freq: Once | INTRAMUSCULAR | Status: AC
  Administered 2020-05-13: 1 mg via INTRAVENOUS
  Filled 2020-05-13: qty 1

## 2020-05-13 MED ORDER — ONDANSETRON HCL 4 MG/2ML IJ SOLN
4.0000 mg | Freq: Once | INTRAMUSCULAR | Status: AC
  Administered 2020-05-13: 4 mg via INTRAVENOUS
  Filled 2020-05-13: qty 2

## 2020-05-13 MED ORDER — SODIUM CHLORIDE 0.9 % IV SOLN: INTRAVENOUS | Status: DC

## 2020-05-13 MED ORDER — DIAZEPAM 5 MG PO TABS
10.0000 mg | ORAL_TABLET | ORAL | Status: AC
  Administered 2020-05-13: 10 mg via ORAL

## 2020-05-13 MED ORDER — OXYCODONE HCL 5 MG PO TABS: 2.5000 mg | ORAL_TABLET | Freq: Four times a day (QID) | ORAL | 0 refills | Status: AC | PRN

## 2020-05-13 MED ORDER — DIPHENHYDRAMINE HCL 25 MG PO CAPS
25.0000 mg | ORAL_CAPSULE | ORAL | Status: AC
  Administered 2020-05-13: 25 mg via ORAL

## 2020-05-13 MED ORDER — ONDANSETRON 4 MG PO TBDP: 4.0000 mg | ORAL_TABLET | Freq: Three times a day (TID) | ORAL | 0 refills | Status: AC | PRN

## 2020-05-13 MED ORDER — OXYCODONE HCL 5 MG PO TABS
2.5000 mg | ORAL_TABLET | Freq: Once | ORAL | Status: AC
  Administered 2020-05-13: 2.5 mg via ORAL
  Filled 2020-05-13: qty 1

## 2020-05-13 MED ORDER — MORPHINE SULFATE (PF) 4 MG/ML IV SOLN
4.0000 mg | Freq: Once | INTRAVENOUS | Status: AC
  Administered 2020-05-13: 4 mg via INTRAVENOUS
  Filled 2020-05-13: qty 1

## 2020-05-13 MED ORDER — KETOROLAC TROMETHAMINE 30 MG/ML IJ SOLN
30.0000 mg | Freq: Once | INTRAMUSCULAR | Status: AC
  Administered 2020-05-13: 30 mg via INTRAVENOUS
  Filled 2020-05-13: qty 1

## 2020-05-13 NOTE — Op Note (Signed)
See [facility] OP note scanned into chart. Also because of the size, density, location and other factors that cannot be anticipated I feel this will likely be a staged procedure. This fact supersedes any indication in the scanned [facility] operative note to the contrary.  

## 2020-05-13 NOTE — ED Notes (Signed)
Bladder  Scan done  -  Was showing up 0 ml

## 2020-05-13 NOTE — ED Provider Notes (Signed)
[facility] EMERGENCY DEPT Provider Note   CSN: [id number] Arrival date & time: 05/13/20  2152     History Chief Complaint  Patient presents with  . Abdominal Pain    [name] is a 60 y.o. male.  Pt presents to the ED today with abdominal pain and nausea.  Pt had lithotripsy today with Dr. Alvester Morin for kidney stones.  He took a tramadol at 2115, but it has not helped.  Pt also feels like he needs to urinate, but only a little comes out.  He was able to urinate after the procedure.        Past Medical History:  Diagnosis Date  . Arthritis   . Dysrhythmia   . Gastric ulcer   . GERD (gastroesophageal reflux disease)   . History of kidney stones   . PAC (premature atrial contraction)   . PVC (premature ventricular contraction)   . Reflux   . Sleep apnea     There are no problems to display for this patient.   Past Surgical History:  Procedure Laterality Date  . MINOR AMPUTATION OF DIGIT  02/17/2012   Procedure: MINOR AMPUTATION OF DIGIT;  Surgeon: Tami Ribas, MD;  Location: [facility];  Service: Orthopedics;  Laterality: Right;  revision amputation right ring finger        No family history on file.  Social History   Tobacco Use  . Smoking status: Former Games developer  . Smokeless tobacco: Never Used  Substance Use Topics  . Alcohol use: Yes    Alcohol/week: 14.0 standard drinks    Types: 7 Glasses of wine, 7 Cans of beer per week  . Drug use: Yes    Types: Other-see comments    Comment: THC, hemp    Home Medications Prior to Admission medications   Medication Sig Start Date End Date Taking? Authorizing Provider  cephALEXin (KEFLEX) 500 MG capsule Take 1 capsule (500 mg total) by mouth 4 (four) times daily. 02/16/12   Margarita Grizzle, MD  diltiazem (CARDIZEM) 120 MG tablet Take 120 mg by mouth daily.    [provider]  doxazosin (CARDURA) 4 MG tablet Take 4 mg by mouth at bedtime.    [provider]  fluticasone  (FLONASE) 50 MCG/ACT nasal spray Place 2 sprays into the nose daily.    [provider]  meloxicam (MOBIC) 15 MG tablet Take 15 mg by mouth daily.    [provider]  Omeprazole (PRILOSEC PO) Take by mouth.    [provider]  oxyCODONE-acetaminophen (PERCOCET) 5-325 MG per tablet 1-2 tabs po q6 hours prn pain 02/17/12   Betha Loa, MD  oxyCODONE-acetaminophen (PERCOCET/ROXICET) 5-325 MG per tablet Take 1 tablet by mouth every 4 (four) hours as needed for pain. 02/16/12   Margarita Grizzle, MD  tamsulosin (FLOMAX) 0.4 MG CAPS capsule Take 0.4 mg by mouth daily.    [provider]  traMADol (ULTRAM) 50 MG tablet Take by mouth every 6 (six) hours as needed.    [provider]  verapamil (CALAN) 120 MG tablet Take 120 mg by mouth 3 (three) times daily.    [provider]    Allergies    Patient has no known allergies.  Review of Systems   Review of Systems  Gastrointestinal: Positive for abdominal pain, nausea and vomiting.  All other systems reviewed and are negative.   Physical Exam Updated Vital Signs BP (!) 158/104 (BP Location: Left Arm)   Pulse (!) 56  Temp 98.2 F (36.8 C) (Oral)   Resp 18   Ht 6\' 2"  (1.88 m)   Wt 90.1 kg   SpO2 98%   BMI 25.51 kg/m   Physical Exam Vitals and nursing note reviewed.  Constitutional:      Appearance: He is well-developed.  HENT:     Head: Normocephalic and atraumatic.     Mouth/Throat:     Mouth: Mucous membranes are moist.     Pharynx: Oropharynx is clear.  Eyes:     Extraocular Movements: Extraocular movements intact.     Pupils: Pupils are equal, round, and reactive to light.  Cardiovascular:     Rate and Rhythm: Normal rate and regular rhythm.     Heart sounds: Normal heart sounds.  Pulmonary:     Effort: Pulmonary effort is normal.     Breath sounds: Normal breath sounds.  Abdominal:     General: Abdomen is flat. Bowel sounds are normal.     Palpations: Abdomen is soft.   Skin:    General: Skin is warm.     Capillary Refill: Capillary refill takes less than 2 seconds.  Neurological:     General: No focal deficit present.     Mental Status: He is alert and oriented to person, place, and time.  Psychiatric:        Mood and Affect: Mood normal.        Behavior: Behavior normal.     ED Results / Procedures / Treatments   Labs (all labs ordered are listed, but only abnormal results are displayed) Labs Reviewed  CBC WITH DIFFERENTIAL/PLATELET - Abnormal; Notable for the following components:      Result Value   WBC 12.8 (*)    Neutro Abs 9.1 (*)    Monocytes Absolute 1.1 (*)    All other components within normal limits  COMPREHENSIVE METABOLIC PANEL - Abnormal; Notable for the following components:   Glucose, Bld 115 (*)    All other components within normal limits  URINALYSIS, ROUTINE W REFLEX MICROSCOPIC - Abnormal; Notable for the following components:   Color, Urine BROWN (*)    APPearance CLOUDY (*)    Hgb urine dipstick LARGE (*)    Protein, ur 100 (*)    Leukocytes,Ua TRACE (*)    RBC / HPF >50 (*)    All other components within normal limits  LIPASE, BLOOD    EKG None  Radiology No results found.  Procedures Procedures   Medications Ordered in ED Medications  sodium chloride 0.9 % bolus 1,000 mL (1,000 mLs Intravenous New Bag/Given 05/13/20 2216)  morphine 4 MG/ML injection 4 mg (4 mg Intravenous Given 05/13/20 2214)  ondansetron (ZOFRAN) injection 4 mg (4 mg Intravenous Given 05/13/20 2216)  ketorolac (TORADOL) 30 MG/ML injection 30 mg (30 mg Intravenous Given 05/13/20 2236)  HYDROmorphone (DILAUDID) injection 1 mg (1 mg Intravenous Given 05/13/20 2247)    ED Course  I have reviewed the triage vital signs and the nursing notes.  Pertinent labs & imaging results that were available during my care of the patient were reviewed by me and considered in my medical decision making (see chart for details).    MDM Rules/Calculators/A&P                           The bladder scanner did not register any urine.  So, the nurse placed a foley to see how much urine was in pt's bladder.  He  only had about 10 cc.  Pt is still in pain, so he is signed out to Dr. Eudelia Bunch at shift change.  Final Clinical Impression(s) / ED Diagnoses Final diagnoses:  Renal colic on left side    Rx / DC Orders ED Discharge Orders    None       Jacalyn Lefevre, MD 05/13/20 2256

## 2020-05-13 NOTE — ED Notes (Signed)
ED Provider at bedside. 

## 2020-05-13 NOTE — H&P (Signed)
See scanned H&P

## 2020-05-13 NOTE — ED Provider Notes (Signed)
I assumed care of this patient.  Please see previous provider note for further details of Hx, PE.  Briefly patient is a 60 y.o. male who presented flank pain following Lithotripsy earlier in the day. UA w/o infection.   Pending reassessment for pain control. Pain improved with dilaudid. Given additional dose of Roxi. Will Rx short course of Roxi since tramadol is not working.   The patient appears reasonably screened and/or stabilized for discharge and I doubt any other medical condition or other [facility] requiring further screening, evaluation, or treatment in the ED at this time prior to discharge. Safe for discharge with strict return precautions.  Disposition: Discharge  Condition: Good  I have discussed the results, Dx and Tx plan with the patient/family who expressed understanding and agree(s) with the plan. Discharge instructions discussed at length. The patient/family was given strict return precautions who verbalized understanding of the instructions. No further questions at time of discharge.    ED Discharge Orders         Ordered    oxyCODONE (ROXICODONE) 5 MG immediate release tablet  Every 6 hours PRN        05/13/20 2320    ondansetron (ZOFRAN ODT) 4 MG disintegrating tablet  Every 8 hours PRN        05/13/20 2320          [facility] narcotic database reviewed and patient has Rx for Tramadol, but was instructed to not take his tramadol during the short course of Oxycodone Rx provided. Patient and wife understood and agreed.   Follow Up: Crista Elliot, MD [address] [place] [address] [phone]  Schedule an appointment as soon as possible for a visit  As needed          Nira Conn, MD 05/13/20 2322

## 2020-05-13 NOTE — ED Notes (Signed)
In and out in done  - got approx 10 ml

## 2020-05-13 NOTE — ED Triage Notes (Addendum)
Pt is c/o lower abd pain and vomiting - denies fever   Pt has hx of kidney stones  And had lithotripsy today    Pain score 8 / 10  -  Had tramadol at 2115

## 2020-05-13 NOTE — Discharge Instructions (Signed)

## 2020-05-14 ENCOUNTER — Encounter (HOSPITAL_BASED_OUTPATIENT_CLINIC_OR_DEPARTMENT_OTHER): Payer: Self-pay | Admitting: Urology

## 2021-11-02 IMAGING — DX DG ABDOMEN 1V
2 series · 2 of 2 positions shown · non-contrast
Comparison: None.

CLINICAL DATA: Pre lithotripsy evaluation.  Left stone.

EXAM:
ABDOMEN - 1 VIEW

[abdomen kub (1 of 2)]
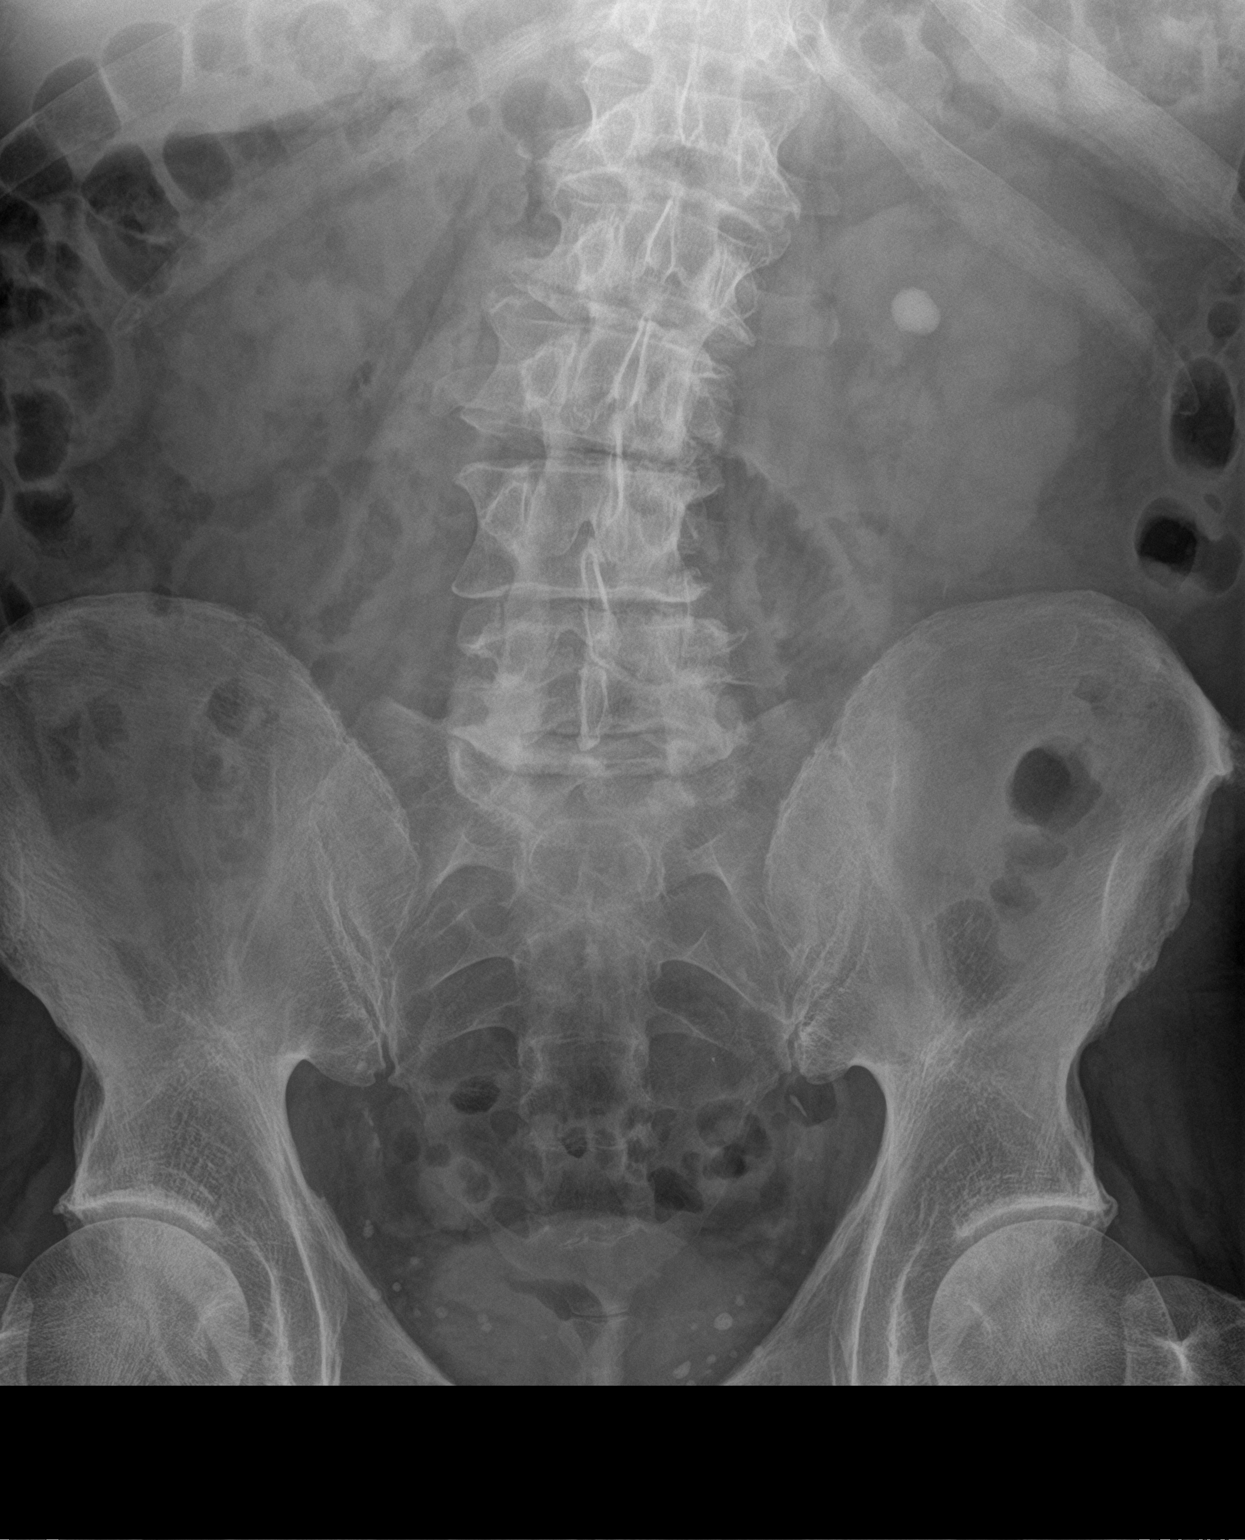

[abdomen kub (2 of 2)]
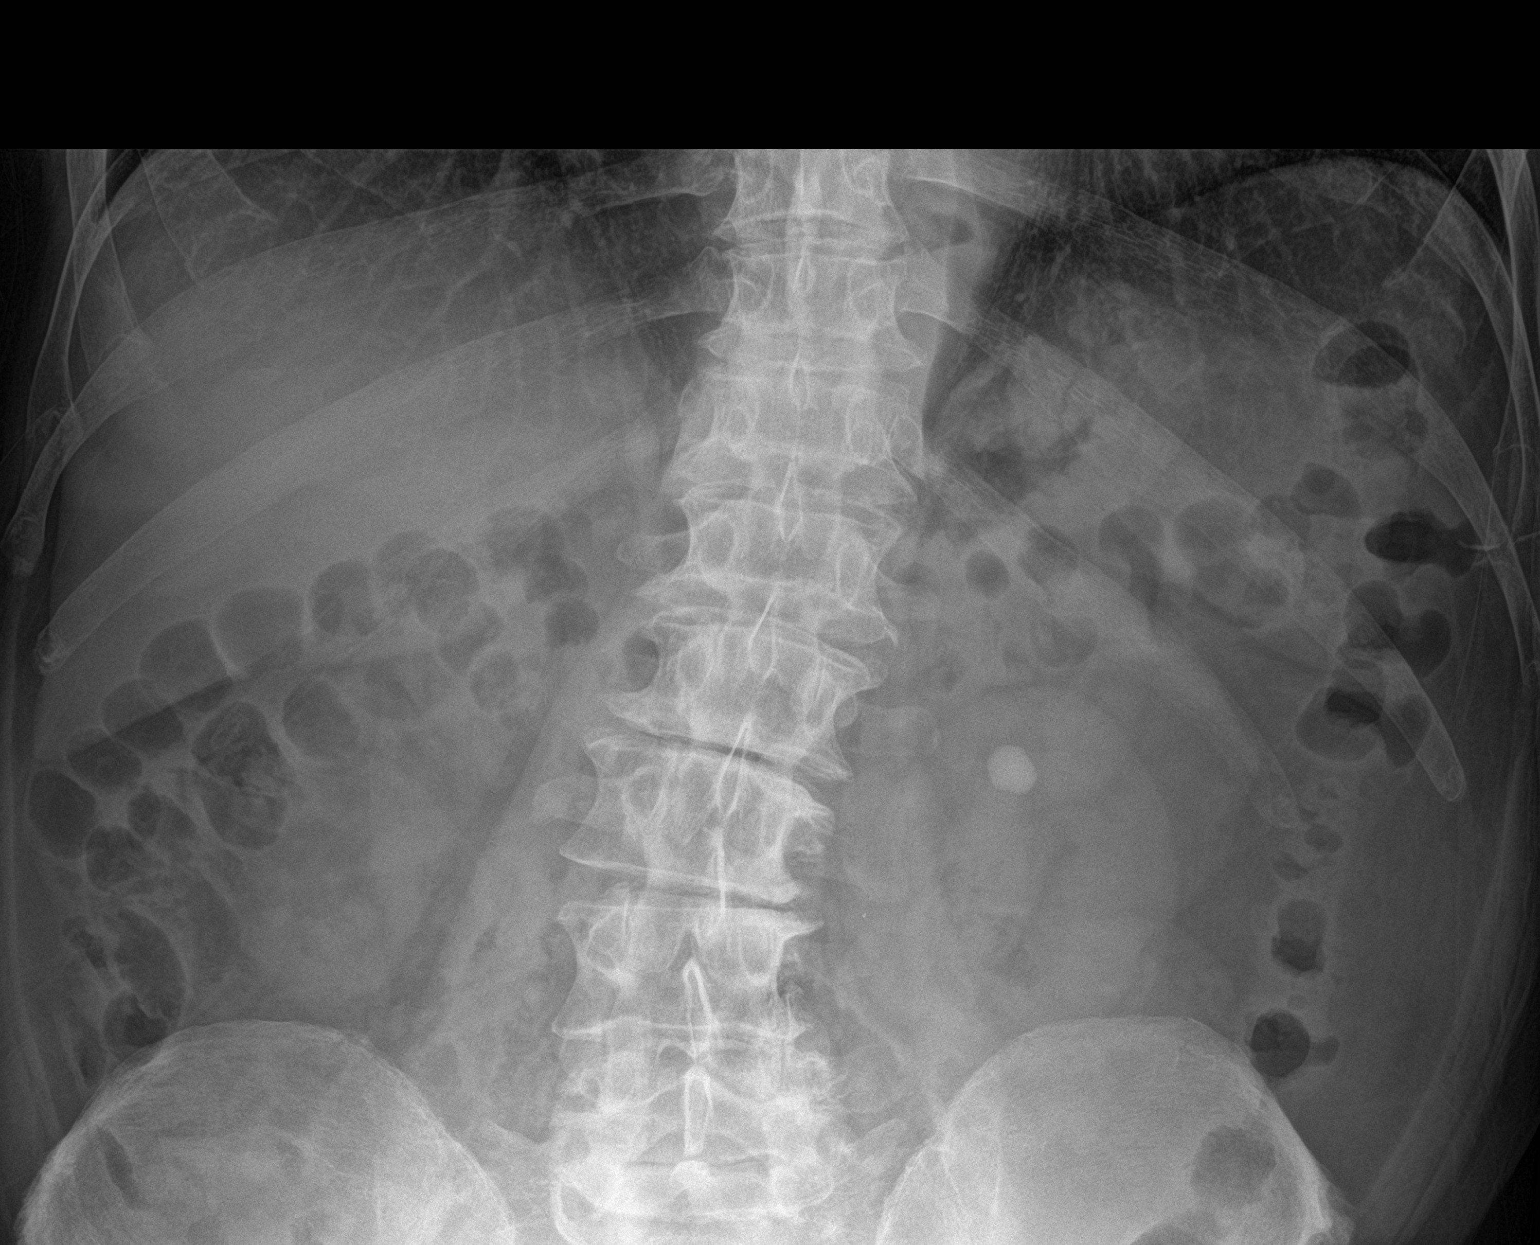

[2 of 2 positions shown; findings below may reference images not displayed]

FINDINGS: 14 mm stone within the upper pole of the left kidney. No other renal
calculi. No sign of passing stone. Multiple phleboliths are present
in the pelvis. Mild curvature in degenerative change of the spine.
IMPRESSION: 14 mm stone upper pole left kidney.
